# Patient Record
Sex: Male | Born: 1960 | Race: White | Hispanic: No | Marital: Married | Smoking: Former smoker
Health system: Southern US, Community
[De-identification: ages and names within clinical notes are randomized; demographics above are authoritative.]

## PROBLEM LIST (undated history)

## (undated) DIAGNOSIS — E119 Type 2 diabetes mellitus without complications: Secondary | ICD-10-CM

## (undated) DIAGNOSIS — E7800 Pure hypercholesterolemia, unspecified: Secondary | ICD-10-CM

## (undated) DIAGNOSIS — J45909 Unspecified asthma, uncomplicated: Secondary | ICD-10-CM

## (undated) HISTORY — PX: KNEE SURGERY: SHX244

## (undated) HISTORY — PX: HAND SURGERY: SHX662

---

## 2007-02-13 ENCOUNTER — Ambulatory Visit: Payer: Self-pay | Admitting: Cardiology

## 2007-02-19 ENCOUNTER — Ambulatory Visit: Payer: Self-pay | Admitting: Cardiology

## 2007-02-20 ENCOUNTER — Inpatient Hospital Stay (HOSPITAL_BASED_OUTPATIENT_CLINIC_OR_DEPARTMENT_OTHER): Admission: RE | Admit: 2007-02-20 | Discharge: 2007-02-20 | Payer: Self-pay | Admitting: Cardiology

## 2007-02-20 ENCOUNTER — Ambulatory Visit: Payer: Self-pay | Admitting: Cardiology

## 2010-09-12 ENCOUNTER — Emergency Department: Payer: Self-pay | Admitting: Emergency Medicine

## 2014-08-31 ENCOUNTER — Emergency Department (HOSPITAL_COMMUNITY): Payer: Self-pay

## 2014-08-31 ENCOUNTER — Emergency Department (HOSPITAL_COMMUNITY)
Admission: EM | Admit: 2014-08-31 | Discharge: 2014-08-31 | Disposition: A | Discharge status: Home / Self Care | Payer: Self-pay | Attending: Emergency Medicine | Admitting: Emergency Medicine

## 2014-08-31 ENCOUNTER — Encounter (HOSPITAL_COMMUNITY): Payer: Self-pay | Admitting: Emergency Medicine

## 2014-08-31 DIAGNOSIS — F17200 Nicotine dependence, unspecified, uncomplicated: Secondary | ICD-10-CM | POA: Insufficient documentation

## 2014-08-31 DIAGNOSIS — Z79899 Other long term (current) drug therapy: Secondary | ICD-10-CM | POA: Insufficient documentation

## 2014-08-31 DIAGNOSIS — R42 Dizziness and giddiness: Secondary | ICD-10-CM | POA: Insufficient documentation

## 2014-08-31 DIAGNOSIS — J45909 Unspecified asthma, uncomplicated: Secondary | ICD-10-CM | POA: Insufficient documentation

## 2014-08-31 DIAGNOSIS — Z8639 Personal history of other endocrine, nutritional and metabolic disease: Secondary | ICD-10-CM | POA: Insufficient documentation

## 2014-08-31 DIAGNOSIS — Z862 Personal history of diseases of the blood and blood-forming organs and certain disorders involving the immune mechanism: Secondary | ICD-10-CM | POA: Insufficient documentation

## 2014-08-31 HISTORY — DX: Unspecified asthma, uncomplicated: J45.909

## 2014-08-31 HISTORY — DX: Pure hypercholesterolemia, unspecified: E78.00

## 2014-08-31 LAB — CBC WITH DIFFERENTIAL/PLATELET
Basophils Absolute: 0 10*3/uL (ref 0.0–0.1)
Basophils Relative: 0 % (ref 0–1)
EOS ABS: 0.2 10*3/uL (ref 0.0–0.7)
Eosinophils Relative: 2 % (ref 0–5)
HCT: 43.1 % (ref 39.0–52.0)
HEMOGLOBIN: 15.1 g/dL (ref 13.0–17.0)
LYMPHS ABS: 2.5 10*3/uL (ref 0.7–4.0)
Lymphocytes Relative: 30 % (ref 12–46)
MCH: 32.1 pg (ref 26.0–34.0)
MCHC: 35 g/dL (ref 30.0–36.0)
MCV: 91.5 fL (ref 78.0–100.0)
Monocytes Absolute: 0.6 10*3/uL (ref 0.1–1.0)
Monocytes Relative: 8 % (ref 3–12)
NEUTROS PCT: 60 % (ref 43–77)
Neutro Abs: 5 10*3/uL (ref 1.7–7.7)
Platelets: 177 10*3/uL (ref 150–400)
RBC: 4.71 MIL/uL (ref 4.22–5.81)
RDW: 12.9 % (ref 11.5–15.5)
WBC: 8.3 10*3/uL (ref 4.0–10.5)

## 2014-08-31 LAB — BASIC METABOLIC PANEL
Anion gap: 9 (ref 5–15)
BUN: 12 mg/dL (ref 6–23)
CO2: 29 meq/L (ref 19–32)
Calcium: 9.2 mg/dL (ref 8.4–10.5)
Chloride: 104 mEq/L (ref 96–112)
Creatinine, Ser: 1.02 mg/dL (ref 0.50–1.35)
GFR calc Af Amer: >90 mL/min (ref >90)
GFR, EST NON AFRICAN AMERICAN: 83 mL/min — AB (ref >90)
GLUCOSE: 92 mg/dL (ref 70–99)
POTASSIUM: 3.9 meq/L (ref 3.7–5.3)
Sodium: 142 mEq/L (ref 137–147)

## 2014-08-31 LAB — CBG MONITORING, ED: Glucose-Capillary: 120 mg/dL — ABNORMAL HIGH (ref 70–99)

## 2014-08-31 MED ORDER — LORAZEPAM 1 MG PO TABS: 1.0000 mg | ORAL_TABLET | Freq: Three times a day (TID) | ORAL | Status: DC | PRN

## 2014-08-31 MED ORDER — MECLIZINE HCL 25 MG PO TABS: 25.0000 mg | ORAL_TABLET | Freq: Three times a day (TID) | ORAL | Status: DC | PRN

## 2014-08-31 NOTE — ED Provider Notes (Signed)
CSN: [id number]     Arrival date & time 08/31/14  1435 History   This chart was scribed for Donnetta Hutching, MD by Freida Busman, ED Scribe. This patient was seen in room APA03/APA03 and the patient's care was started 3:20 PM.    Chief Complaint  Patient presents with  . Dizziness    The history is provided by the patient and the spouse. No language interpreter was used.   HPI Comments:  Vincent Singleton is a 53 y.o. male who presents to the Emergency Department complaining of dizziness that started this am. Pt reports getting out of his car and  feeling "off balance" . Pt took meclizine and fell asleep for 3 hours but when he woke up dizziness was still present. Pt denies weakness to his extremities. Also denies recent illness.and h/o similar symptom. No stiff neck, fever, chills.  No chronic health problems. No medication     Past Medical History  Diagnosis Date  . Asthma   . High cholesterol    Past Surgical History  Procedure Laterality Date  . Hand surgery    . Knee surgery     No family history on file. History  Substance Use Topics  . Smoking status: Current Every Day Smoker -- 1.00 packs/day    Types: Cigarettes  . Smokeless tobacco: Not on file  . Alcohol Use: No    Review of Systems  At least 10pt or greater review of systems completed and are negative except where specified in the HPI.     Allergies  Review of patient's allergies indicates no known allergies.  Home Medications   Prior to Admission medications   Medication Sig Start Date End Date Taking? Authorizing Provider  ibuprofen (ADVIL,MOTRIN) 200 MG tablet Take 400 mg by mouth every 6 (six) hours as needed for mild pain or moderate pain.   Yes Historical Provider, MD  meclizine (ANTIVERT) 12.5 MG tablet Take 12.5 mg by mouth once as needed for dizziness.   Yes Historical Provider, MD  LORazepam (ATIVAN) 1 MG tablet Take 1 tablet (1 mg total) by mouth 3 (three) times daily as needed (dizziness). 08/31/14    Donnetta Hutching, MD  meclizine (ANTIVERT) 25 MG tablet Take 1 tablet (25 mg total) by mouth 3 (three) times daily as needed for dizziness. 08/31/14   Donnetta Hutching, MD   BP 109/60  Pulse 55  Temp(Src) 97.7 F (36.5 C) (Oral)  Resp 14  Ht  (1.778 m)  Wt 240 lb (108.863 kg)  BMI 34.44 kg/m2  SpO2 99% Physical Exam  Nursing note and vitals reviewed. Constitutional: He is oriented to person, place, and time. He appears well-developed and well-nourished.  HENT:  Head: Normocephalic and atraumatic.  Eyes: Conjunctivae and EOM are normal. Pupils are equal, round, and reactive to light.  Neck: Normal range of motion. Neck supple.  Cardiovascular: Normal rate, regular rhythm and normal heart sounds.   Pulmonary/Chest: Effort normal and breath sounds normal.  Abdominal: Soft. Bowel sounds are normal.  Musculoskeletal: Normal range of motion.  Neurological: He is alert and oriented to person, place, and time.  Skin: Skin is warm and dry.  Psychiatric: He has a normal mood and affect. His behavior is normal.    ED Course  Procedures   DIAGNOSTIC STUDIES:  Oxygen Saturation is 100% on RA, normal by my interpretation.    COORDINATION OF CARE:  3:23 PM Discussed treatment plan with pt at bedside and pt agreed to plan.  Labs Review  Labs Reviewed  BASIC METABOLIC PANEL - Abnormal; Notable for the following:    GFR calc non Af Amer 83 (*)    All other components within normal limits  CBG MONITORING, ED - Abnormal; Notable for the following:    Glucose-Capillary 120 (*)    All other components within normal limits  CBC WITH DIFFERENTIAL    Imaging Review Ct Head Wo Contrast  08/31/2014   CLINICAL DATA:  DIZZINESS  EXAM: CT HEAD WITHOUT CONTRAST  TECHNIQUE: Contiguous axial images were obtained from the base of the skull through the vertex without intravenous contrast.  COMPARISON:  None.  FINDINGS: There is no evidence of mass effect, midline shift or extra-axial fluid collections.  There is no evidence of a space-occupying lesion or intracranial hemorrhage. There is no evidence of a cortical-based area of acute infarction.  The ventricles and sulci are appropriate for the patient's age. The basal cisterns are patent.  Visualized portions of the orbits are unremarkable. The visualized portions of the paranasal sinuses and mastoid air cells are unremarkable.  The osseous structures are unremarkable.  IMPRESSION: Normal CT of the brain without intravenous contrast.   Electronically Signed   By: Elige Ko   On: 08/31/2014 16:13     EKG Interpretation   Date/Time:  Monday August 31 2014 14:42:02 EDT Ventricular Rate:  52 PR Interval:  140 QRS Duration: 100 QT Interval:  386 QTC Calculation: 358 R Axis:   87 Text Interpretation:  Sinus bradycardia Nonspecific T wave abnormality  Abnormal ECG No previous ECGs available Confirmed by Manus Gunning  MD, STEPHEN  ([id number]) on 08/31/2014 3:02:07 PM      MDM   Final diagnoses:  Vertigo    No gross neurological deficits. CT head negative. Labs negative. Patient is alert and oriented x3, moving all extremities vigorously. History and physical most consistent with vertigo. Discharge medications Antivert 25 mg, Ativan 1 mg   I personally performed the services described in this documentation, which was scribed in my presence. The recorded information has been reviewed and is accurate.    Donnetta Hutching, MD 08/31/14 1755

## 2014-08-31 NOTE — ED Notes (Signed)
CBG in triage 120.

## 2014-08-31 NOTE — ED Notes (Addendum)
Pt c/o intermittent dizziness and blurred vision, worse with movement since 0645 this am. Tingling in left hand. Face symmetrical, speech clear.denies ha. Pt took meclizine at 0900 this am and went to sleep and woke up with no relief of sx.

## 2014-08-31 NOTE — Discharge Instructions (Signed)
Benign Positional Vertigo Vertigo means you feel like you or your surroundings are moving when they are not. Benign positional vertigo is the most common form of vertigo. Benign means that the cause of your condition is not serious. Benign positional vertigo is more common in older adults. CAUSES  Benign positional vertigo is the result of an upset in the labyrinth system. This is an area in the middle ear that helps control your balance. This may be caused by a viral infection, head injury, or repetitive motion. However, often no specific cause is found. SYMPTOMS  Symptoms of benign positional vertigo occur when you move your head or eyes in different directions. Some of the symptoms may include:  Loss of balance and falls.  Vomiting.  Blurred vision.  Dizziness.  Nausea.  Involuntary eye movements (nystagmus). DIAGNOSIS  Benign positional vertigo is usually diagnosed by physical exam. If the specific cause of your benign positional vertigo is unknown, your caregiver may perform imaging tests, such as magnetic resonance imaging (MRI) or computed tomography (CT). TREATMENT  Your caregiver may recommend movements or procedures to correct the benign positional vertigo. Medicines such as meclizine, benzodiazepines, and medicines for nausea may be used to treat your symptoms. In rare cases, if your symptoms are caused by certain conditions that affect the inner ear, you may need surgery. HOME CARE INSTRUCTIONS   Follow your caregiver's instructions.  Move slowly. Do not make sudden body or head movements.  Avoid driving.  Avoid operating heavy machinery.  Avoid performing any tasks that would be dangerous to you or others during a vertigo episode.  Drink enough fluids to keep your urine clear or pale yellow. SEEK IMMEDIATE MEDICAL CARE IF:   You develop problems with walking, weakness, numbness, or using your arms, hands, or legs.  You have difficulty speaking.  You develop  severe headaches.  Your nausea or vomiting continues or gets worse.  You develop visual changes.  Your family or friends notice any behavioral changes.  Your condition gets worse.  You have a fever.  You develop a stiff neck or sensitivity to light. MAKE SURE YOU:   Understand these instructions.  Will watch your condition.  Will get help right away if you are not doing well or get worse. Document Released: 09/04/2006 Document Revised: 02/19/2012 Document Reviewed: 08/17/2011 [facility] Patient Information 2015 [place], [place]. This information is not intended to replace advice given to you by your health care provider. Make sure you discuss any questions you have with your health care provider.   CT scan of head and blood work were normal.  Medication for dizziness and relaxation.  Return if worse

## 2019-12-15 ENCOUNTER — Other Ambulatory Visit: Payer: Self-pay

## 2019-12-15 ENCOUNTER — Ambulatory Visit: Payer: Self-pay | Attending: Internal Medicine

## 2019-12-15 DIAGNOSIS — Z20822 Contact with and (suspected) exposure to covid-19: Secondary | ICD-10-CM | POA: Insufficient documentation

## 2019-12-16 LAB — NOVEL CORONAVIRUS, NAA: SARS-CoV-2, NAA: NOT DETECTED

## 2019-12-18 ENCOUNTER — Telehealth: Payer: Self-pay | Admitting: *Deleted

## 2019-12-18 ENCOUNTER — Telehealth: Payer: Self-pay

## 2019-12-18 NOTE — Telephone Encounter (Signed)
Patient needs result faxed to [facility]  Attn: Hilda Lias Fax: [phone]

## 2019-12-18 NOTE — Telephone Encounter (Signed)
Called for a copy of negative Covid 19 results. Sent MyChart code via phone # as discussed with the patient.

## 2021-05-30 ENCOUNTER — Emergency Department (HOSPITAL_COMMUNITY)
Admission: EM | Admit: 2021-05-30 | Discharge: 2021-05-31 | Disposition: A | Discharge status: Home / Self Care | Payer: No Typology Code available for payment source | Attending: Emergency Medicine | Admitting: Emergency Medicine

## 2021-05-30 ENCOUNTER — Emergency Department (HOSPITAL_COMMUNITY): Payer: No Typology Code available for payment source

## 2021-05-30 DIAGNOSIS — R079 Chest pain, unspecified: Secondary | ICD-10-CM | POA: Insufficient documentation

## 2021-05-30 DIAGNOSIS — R519 Headache, unspecified: Secondary | ICD-10-CM | POA: Diagnosis not present

## 2021-05-30 DIAGNOSIS — Z87891 Personal history of nicotine dependence: Secondary | ICD-10-CM | POA: Diagnosis not present

## 2021-05-30 DIAGNOSIS — Z20822 Contact with and (suspected) exposure to covid-19: Secondary | ICD-10-CM | POA: Diagnosis not present

## 2021-05-30 DIAGNOSIS — J45909 Unspecified asthma, uncomplicated: Secondary | ICD-10-CM | POA: Diagnosis not present

## 2021-05-30 DIAGNOSIS — E119 Type 2 diabetes mellitus without complications: Secondary | ICD-10-CM | POA: Insufficient documentation

## 2021-05-30 LAB — CBC
HCT: 46.7 % (ref 39.0–52.0)
Hemoglobin: 15.6 g/dL (ref 13.0–17.0)
MCH: 30.5 pg (ref 26.0–34.0)
MCHC: 33.4 g/dL (ref 30.0–36.0)
MCV: 91.2 fL (ref 80.0–100.0)
Platelets: 164 10*3/uL (ref 150–400)
RBC: 5.12 MIL/uL (ref 4.22–5.81)
RDW: 12.3 % (ref 11.5–15.5)
WBC: 7.5 10*3/uL (ref 4.0–10.5)
nRBC: 0 % (ref 0.0–0.2)

## 2021-05-30 LAB — BASIC METABOLIC PANEL
Anion gap: 8 (ref 5–15)
BUN: 15 mg/dL (ref 6–20)
CO2: 26 mmol/L (ref 22–32)
Calcium: 9 mg/dL (ref 8.9–10.3)
Chloride: 104 mmol/L (ref 98–111)
Creatinine, Ser: 1.04 mg/dL (ref 0.61–1.24)
GFR, Estimated: >60 mL/min (ref >60)
Glucose, Bld: 115 mg/dL — ABNORMAL HIGH (ref 70–99)
Potassium: 3.8 mmol/L (ref 3.5–5.1)
Sodium: 138 mmol/L (ref 135–145)

## 2021-05-30 LAB — RAPID URINE DRUG SCREEN, HOSP PERFORMED
Amphetamines: NOT DETECTED
Barbiturates: POSITIVE — AB
Benzodiazepines: NOT DETECTED
Cocaine: NOT DETECTED
Opiates: POSITIVE — AB
Tetrahydrocannabinol: NOT DETECTED

## 2021-05-30 LAB — TROPONIN I (HIGH SENSITIVITY): Troponin I (High Sensitivity): 6 ng/L (ref <18)

## 2021-05-30 NOTE — ED Triage Notes (Signed)
Patient presents with chest pressure and headache. Was seen at [facility] earlier this am with chest pain work up and head CT negative.  Feels he needs to be re evaluated.

## 2021-05-30 NOTE — ED Provider Notes (Signed)
Emergency Medicine Provider Triage Evaluation Note  Vincent Singleton , a 60 y.o. male  was evaluated in triage.  Pt complains of headache and chest pain. He was at [facility] earlier today for the same, had negative head CT, negative delta troponins and was discharged. Patient feels like he needs additional evaluation.  His chest pain has been constant since yesterday as has his headache.  Review of Systems  Positive: Headache, chest pain Negative: Ever, cough  Physical Exam  BP (!) 142/89 (BP Location: Left Arm)   Pulse (!) 47   Temp 98.2 F (36.8 C) (Oral)   Resp 16   Wt 99.8 kg   SpO2 100%   BMI 31.57 kg/m  Gen:   Awake, no distress   Resp:  Normal effort  MSK:   Moves extremities without difficulty  Other:  Normal gait  Medical Decision Making  Medically screening exam initiated at 8:52 PM.  Appropriate orders placed.  Vincent Singleton was informed that the remainder of the evaluation will be completed by another provider, this initial triage assessment does not replace that evaluation, and the importance of remaining in the ED until their evaluation is complete.  Note: Portions of this report may have been transcribed using voice recognition software. Every effort was made to ensure accuracy; however, inadvertent computerized transcription errors may be present    Vincent Singleton 05/30/21 2054    Mancel Bale, MD 05/31/21 1136

## 2021-05-31 ENCOUNTER — Emergency Department (HOSPITAL_BASED_OUTPATIENT_CLINIC_OR_DEPARTMENT_OTHER)
Admission: EM | Admit: 2021-05-31 | Discharge: 2021-05-31 | Disposition: A | Discharge status: Home / Self Care | Payer: No Typology Code available for payment source | Source: Home / Self Care | Attending: Emergency Medicine | Admitting: Emergency Medicine

## 2021-05-31 ENCOUNTER — Emergency Department (HOSPITAL_BASED_OUTPATIENT_CLINIC_OR_DEPARTMENT_OTHER): Payer: No Typology Code available for payment source | Admitting: Radiology

## 2021-05-31 ENCOUNTER — Other Ambulatory Visit: Payer: Self-pay

## 2021-05-31 ENCOUNTER — Encounter (HOSPITAL_BASED_OUTPATIENT_CLINIC_OR_DEPARTMENT_OTHER): Payer: Self-pay | Admitting: Obstetrics and Gynecology

## 2021-05-31 DIAGNOSIS — Z20822 Contact with and (suspected) exposure to covid-19: Secondary | ICD-10-CM | POA: Insufficient documentation

## 2021-05-31 DIAGNOSIS — R519 Headache, unspecified: Secondary | ICD-10-CM

## 2021-05-31 DIAGNOSIS — J45909 Unspecified asthma, uncomplicated: Secondary | ICD-10-CM | POA: Insufficient documentation

## 2021-05-31 DIAGNOSIS — R072 Precordial pain: Secondary | ICD-10-CM

## 2021-05-31 DIAGNOSIS — E119 Type 2 diabetes mellitus without complications: Secondary | ICD-10-CM | POA: Insufficient documentation

## 2021-05-31 DIAGNOSIS — Z87891 Personal history of nicotine dependence: Secondary | ICD-10-CM | POA: Insufficient documentation

## 2021-05-31 HISTORY — DX: Type 2 diabetes mellitus without complications: E11.9

## 2021-05-31 LAB — HEPATIC FUNCTION PANEL
ALT: 13 U/L (ref 0–44)
AST: 14 U/L — ABNORMAL LOW (ref 15–41)
Albumin: 3.9 g/dL (ref 3.5–5.0)
Alkaline Phosphatase: 53 U/L (ref 38–126)
Bilirubin, Direct: 0.1 mg/dL (ref 0.0–0.2)
Indirect Bilirubin: 0.4 mg/dL (ref 0.3–0.9)
Total Bilirubin: 0.5 mg/dL (ref 0.3–1.2)
Total Protein: 6.6 g/dL (ref 6.5–8.1)

## 2021-05-31 LAB — CBC
HCT: 48.4 % (ref 39.0–52.0)
Hemoglobin: 16.3 g/dL (ref 13.0–17.0)
MCH: 30.2 pg (ref 26.0–34.0)
MCHC: 33.7 g/dL (ref 30.0–36.0)
MCV: 89.8 fL (ref 80.0–100.0)
Platelets: 165 10*3/uL (ref 150–400)
RBC: 5.39 MIL/uL (ref 4.22–5.81)
RDW: 12.6 % (ref 11.5–15.5)
WBC: 7.2 10*3/uL (ref 4.0–10.5)
nRBC: 0 % (ref 0.0–0.2)

## 2021-05-31 LAB — BASIC METABOLIC PANEL
Anion gap: 7 (ref 5–15)
BUN: 12 mg/dL (ref 6–20)
CO2: 29 mmol/L (ref 22–32)
Calcium: 9.4 mg/dL (ref 8.9–10.3)
Chloride: 101 mmol/L (ref 98–111)
Creatinine, Ser: 1.07 mg/dL (ref 0.61–1.24)
GFR, Estimated: >60 mL/min (ref >60)
Glucose, Bld: 172 mg/dL — ABNORMAL HIGH (ref 70–99)
Potassium: 4 mmol/L (ref 3.5–5.1)
Sodium: 137 mmol/L (ref 135–145)

## 2021-05-31 LAB — LIPASE, BLOOD: Lipase: 20 U/L (ref 11–51)

## 2021-05-31 LAB — RESP PANEL BY RT-PCR (FLU A&B, COVID) ARPGX2
Influenza A by PCR: NEGATIVE
Influenza B by PCR: NEGATIVE
SARS Coronavirus 2 by RT PCR: NEGATIVE

## 2021-05-31 LAB — TSH: TSH: 1.182 u[IU]/mL (ref 0.350–4.500)

## 2021-05-31 LAB — TROPONIN I (HIGH SENSITIVITY): Troponin I (High Sensitivity): 3 ng/L (ref <18)

## 2021-05-31 NOTE — Discharge Instructions (Addendum)
1.  Continue with all of your planned follow-up appointments. 2.  Take Tylenol as needed for headache and try to rest as much as possible. 3.  Return to the emergency department if there are are new associated symptoms, worsening of symptoms you have or other concerning changes develop.

## 2021-05-31 NOTE — ED Notes (Addendum)
Pt assisted with ambulation in hallway on EKG monitor.  HR 73, sinus, O2 sat 99-100% on room air.  Dr Donnald Garre aware.

## 2021-05-31 NOTE — ED Provider Notes (Signed)
[facility] EMERGENCY DEPT Provider Note   CSN: [id number] Arrival date & time: 05/31/21  1457     History Chief Complaint  Patient presents with   Chest Pain    Vincent Singleton is a 60 y.o. male.  HPI Patient has symptoms of headache and chest discomfort for about 4 days.  He reports symptoms started first with a headache 4 days ago.  It seemed like a "normal" headache.  Patient reports he does not typically get headaches however he woke up in the morning and he had a general aching which responded to ibuprofen.  He did not have associated symptoms.  Headache was gone and no other symptoms develop.  Patient denies has had any double vision or loss of vision.  He reports he has not had his glasses prescription changed for a number of years and he starting to find that his vision is little more blurry.  Patient denies photophobia but does report when we is out in the bright sun he is trying to feel he needs sunglasses.  No nausea or vomiting.  No neck stiffness.  The headache was present the following morning as well.  He reports it seemed better after ibuprofen again.  The next day, it was there and did not seem better and he also developed a feeling of pressure in the left upper chest with some radiation toward the underarm.  He reports it was not as severe chest pain.  It felt like a pressure and he did feel little short of breath with it.  At that point he determined to get evaluation.  Patient denies has had fevers or chills.  He is not any productive cough.  No calf pain or lower extremity swelling.  Patient has not had stress test.  He does have history of diabetes diagnosed about 6 months ago and hypercholesterolemia.  Patient does not have hypertension and is not on antihypertensive medications.   Patient was evaluated at [facility] emergency department and had a CT scan of the head with no acute findings and troponins with no elevation.  Patient was also seen at [facility] emergency  department with stable troponins.  He has follow-up with cardiology scheduled for July 5 and follow-up with PCP at the [facility] this week on Friday.    Past Medical History:  Diagnosis Date   Asthma    Diabetes mellitus without complication (HCC)    High cholesterol     There are no problems to display for this patient.   Past Surgical History:  Procedure Laterality Date   HAND SURGERY     KNEE SURGERY         No family history on file.  Social History   Tobacco Use   Smoking status: Former    Packs/day: 1.00    Years: 40.00    Pack years: 40.00    Types: Cigarettes   Smokeless tobacco: Never  Vaping Use   Vaping Use: Never used  Substance Use Topics   Alcohol use: No   Drug use: No    Home Medications Prior to Admission medications   Medication Sig Start Date End Date Taking? Authorizing Provider  ibuprofen (ADVIL,MOTRIN) 200 MG tablet Take 400 mg by mouth every 6 (six) hours as needed for mild pain or moderate pain.    [provider]  LORazepam (ATIVAN) 1 MG tablet Take 1 tablet (1 mg total) by mouth 3 (three) times daily as needed (dizziness). 08/31/14   Donnetta Hutching, MD  meclizine Lezlie Octave)  12.5 MG tablet Take 12.5 mg by mouth once as needed for dizziness.    [provider]  meclizine (ANTIVERT) 25 MG tablet Take 1 tablet (25 mg total) by mouth 3 (three) times daily as needed for dizziness. 08/31/14   Donnetta Hutching, MD    Allergies    Patient has no known allergies.  Review of Systems   Review of Systems 10 systems reviewed and negative except as per HPI Physical Exam Updated Vital Signs BP 109/72   Pulse (!) 55   Temp 98.2 F (36.8 C) (Oral)   Resp 13   Ht 5\' 11"  (1.803 m)   Wt 99.8 kg   SpO2 97%   BMI 30.68 kg/m   Physical Exam Constitutional:      Appearance: Normal appearance.  HENT:     Head: Normocephalic and atraumatic.     Ears:     Comments: Bilateral TMs with some tympanosclerosis and retraction.  Ear canals are narrow.   No erythema or bulging.    Mouth/Throat:     Mouth: Mucous membranes are moist.     Pharynx: Oropharynx is clear.  Eyes:     Extraocular Movements: Extraocular movements intact.     Conjunctiva/sclera: Conjunctivae normal.     Pupils: Pupils are equal, round, and reactive to light.  Cardiovascular:     Rate and Rhythm: Normal rate and regular rhythm.  Pulmonary:     Effort: Pulmonary effort is normal.     Breath sounds: Normal breath sounds.  Abdominal:     General: There is no distension.     Palpations: Abdomen is soft.     Tenderness: There is no abdominal tenderness. There is no guarding.  Musculoskeletal:        General: No swelling or tenderness. Normal range of motion.     Cervical back: Neck supple.     Right lower leg: No edema.     Left lower leg: No edema.  Skin:    General: Skin is warm and dry.  Neurological:     General: No focal deficit present.     Mental Status: He is alert and oriented to person, place, and time.     Cranial Nerves: No cranial nerve deficit.     Sensory: No sensory deficit.     Motor: No weakness.     Coordination: Coordination normal.  Psychiatric:        Mood and Affect: Mood normal.    ED Results / Procedures / Treatments   Labs (all labs ordered are listed, but only abnormal results are displayed) Labs Reviewed  BASIC METABOLIC PANEL - Abnormal; Notable for the following components:      Result Value   Glucose, Bld 172 (*)    All other components within normal limits  HEPATIC FUNCTION PANEL - Abnormal; Notable for the following components:   AST 14 (*)    All other components within normal limits  RESP PANEL BY RT-PCR (FLU A&B, COVID) ARPGX2  CBC  LIPASE, BLOOD  TSH  TROPONIN I (HIGH SENSITIVITY)    EKG EKG Interpretation  Date/Time:  Tuesday May 31 2021 15:05:53 EDT Ventricular Rate:  53 PR Interval:  142 QRS Duration: 98 QT Interval:  398 QTC Calculation: 373 R Axis:   77 Text Interpretation: Sinus bradycardia  Nonspecific T wave abnormality Abnormal ECG inferior ST changes and bradycardia unchanged from previous Confirmed by 03[birth year]-1996 [phone]) on 05/31/2021 6:10:41 PM  Radiology DG Chest 2 View  Result Date: 05/31/2021  CLINICAL DATA:  Chest pain and pressure. Pain radiates to the left shoulder. Shortness of breath. EXAM: CHEST - 2 VIEW COMPARISON:  Radiographs yesterday. FINDINGS: The cardiomediastinal contours are normal. The lungs are clear. Pulmonary vasculature is normal. No consolidation, pleural effusion, or pneumothorax. No acute osseous abnormalities are seen. IMPRESSION: Negative radiographs of the chest. No radiographic explanation for chest pain or change from exams yesterday. Electronically Signed   By: Narda Rutherford M.D.   On: 05/31/2021 15:40   DG Chest 2 View  Result Date: 05/30/2021 CLINICAL DATA:  Chest pain, dizziness and nausea.  Headache. EXAM: CHEST - 2 VIEW COMPARISON:  Radiograph earlier this day at [facility]. FINDINGS: The cardiomediastinal contours are normal. The lungs are clear. Pulmonary vasculature is normal. No consolidation, pleural effusion, or pneumothorax. Thoracic spondylosis with endplate spurring. No acute osseous abnormalities are seen. IMPRESSION: Negative radiographs of the chest. Electronically Signed   By: Narda Rutherford M.D.   On: 05/30/2021 21:53    Procedures Procedures   Medications Ordered in ED Medications - No data to display  ED Course  I have reviewed the triage vital signs and the nursing notes.  Pertinent labs & imaging results that were available during my care of the patient were reviewed by me and considered in my medical decision making (see chart for details).    MDM Rules/Calculators/A&P                          Patient presents as outlined.  Clinically, exam is normal.  No abnormal neurologic examination.  Patient had CT head done within the past 2 days.  No acute findings.  No findings today to suggest infectious etiology.   No thunderclap headache to suggest subarachnoid.  Patient does not have hypertension.  Headache has been resolving with regular OTC medications.  Consideration given to tension headache but will require further work-up and return precautions reviewed regarding headache  Patient also has been having chest pain.  He does have some risk factors.  EKG is unchanged from 2015 tracing.  Patient has had troponins done over the past several days with normal findings.  At this time recommendation is to follow-up with PCP and cardiology for ongoing outpatient testing.  Return precautions reviewed. Final Clinical Impression(s) / ED Diagnoses Final diagnoses:  Nonintractable headache, unspecified chronicity pattern, unspecified headache type  Precordial pain    Rx / DC Orders ED Discharge Orders     None        Arby Barrette, MD 05/31/21 1818

## 2021-05-31 NOTE — ED Notes (Signed)
Pt called 3x no answer  

## 2021-05-31 NOTE — ED Triage Notes (Signed)
Patient reports to the ER with chest pressure and bradycardia. Patient reports the chest pain is radiating to left shoulder. Patient endorses SOB. Denies N/v/D

## 2021-05-31 NOTE — ED Notes (Signed)
Vitals called x6 

## 2022-10-13 IMAGING — DX DG CHEST 2V
2 series · 2 of 2 positions shown · non-contrast
Comparison: Radiographs yesterday.

CLINICAL DATA: Chest pain and pressure. Pain radiates to the left
shoulder. Shortness of breath.

EXAM:
CHEST - 2 VIEW

[chest pa]
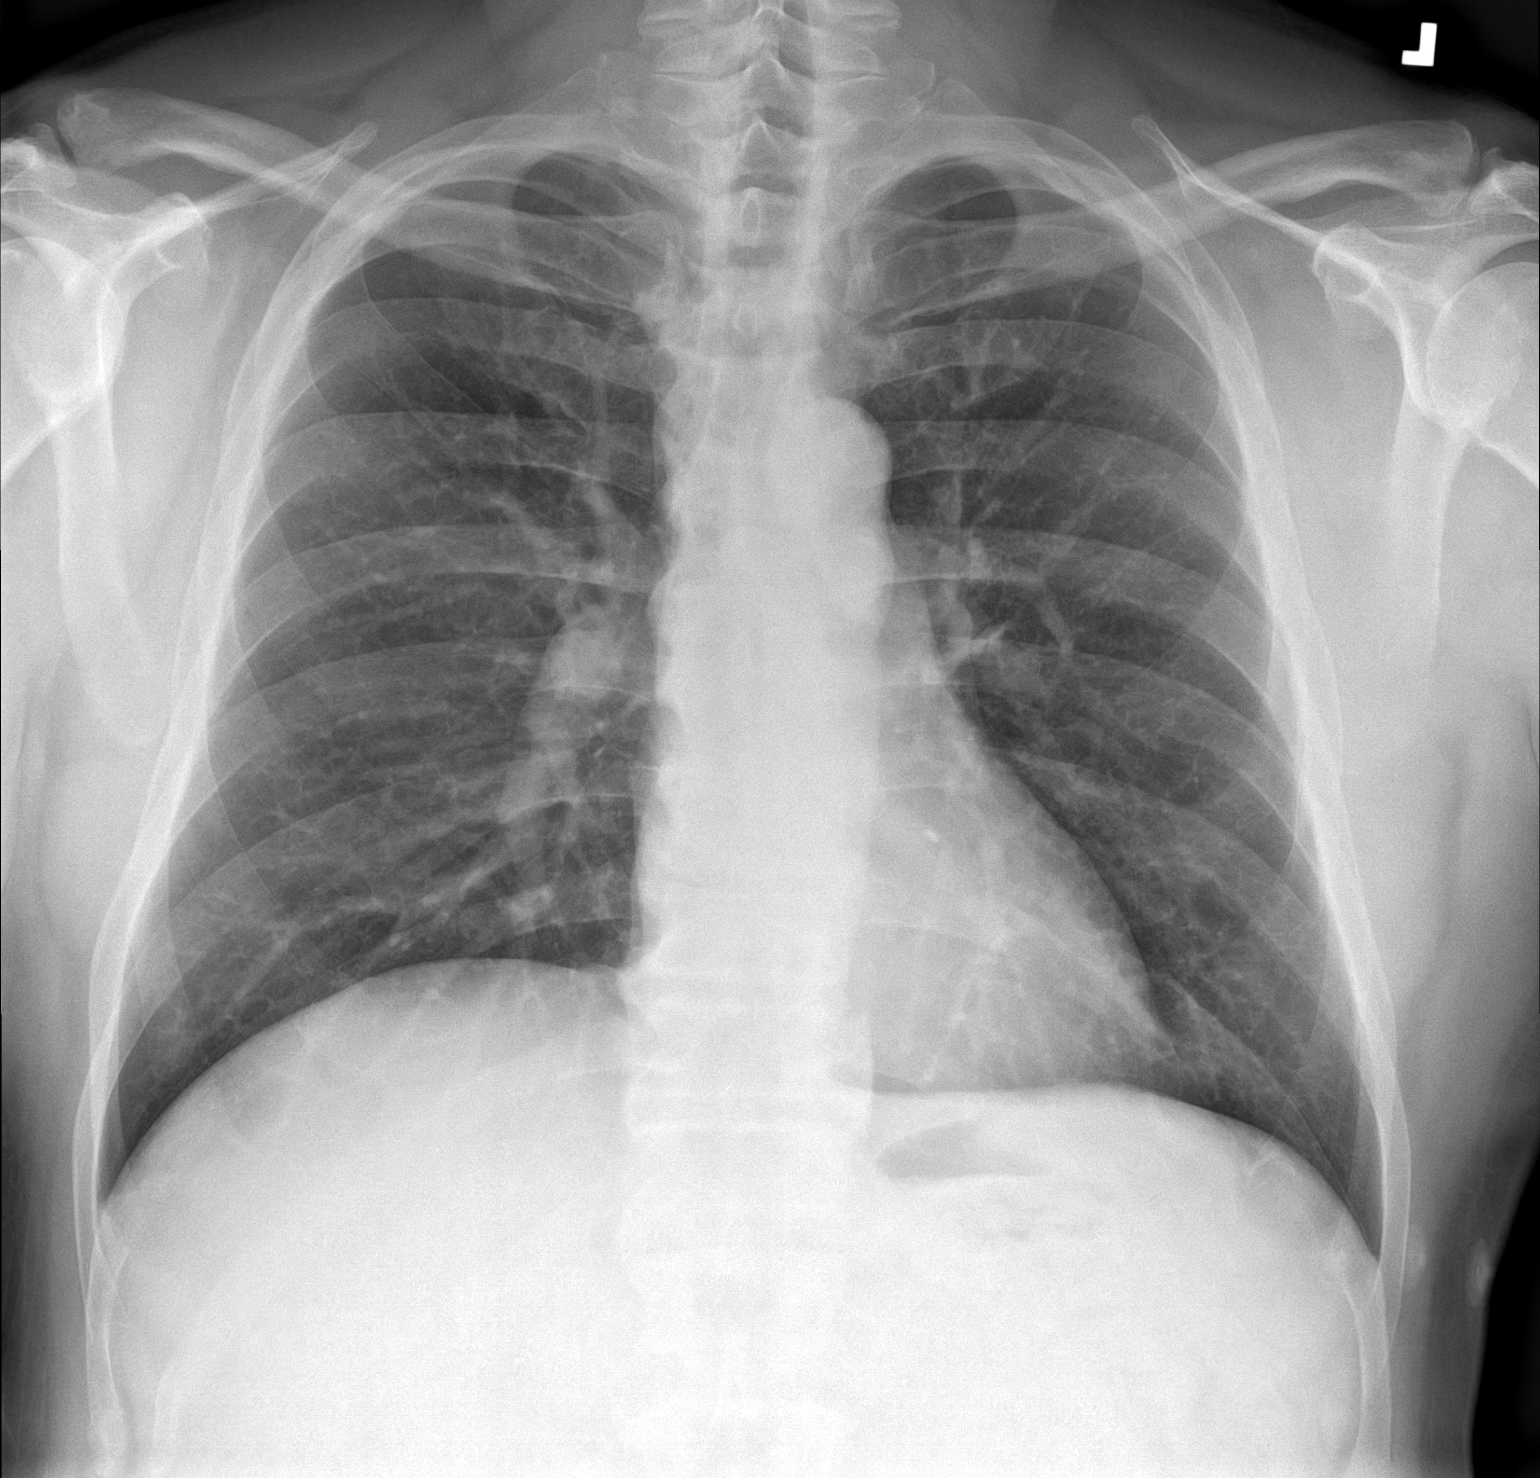

[chest lat]
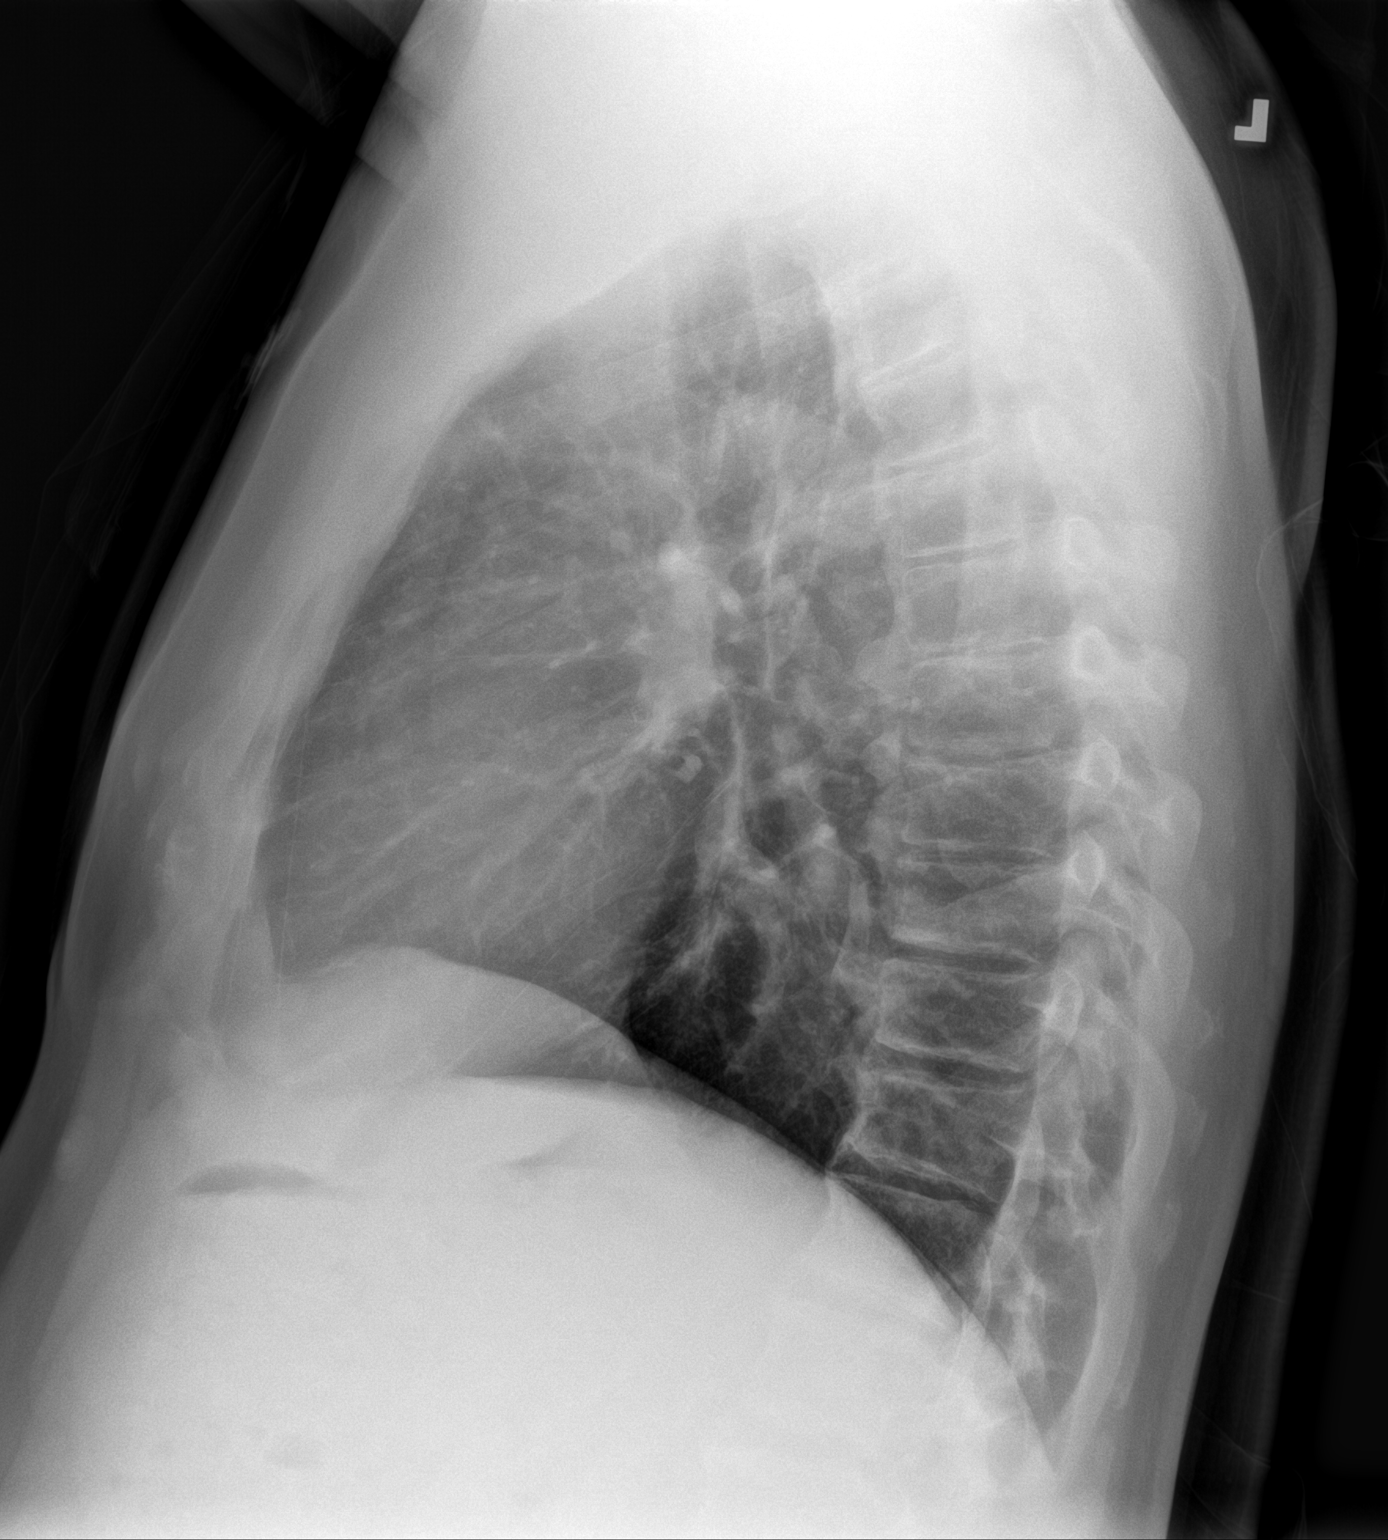

[2 of 2 positions shown; findings below may reference images not displayed]

FINDINGS: The cardiomediastinal contours are normal. The lungs are clear.
Pulmonary vasculature is normal. No consolidation, pleural effusion,
or pneumothorax. No acute osseous abnormalities are seen.
IMPRESSION: Negative radiographs of the chest. No radiographic explanation for
chest pain or change from exams yesterday.

## 2023-06-13 ENCOUNTER — Encounter: Payer: Self-pay | Admitting: *Deleted

## 2023-07-13 ENCOUNTER — Telehealth: Payer: Self-pay | Admitting: *Deleted

## 2023-07-13 NOTE — Telephone Encounter (Signed)
Received call from Vincent Singleton with [facility] that they are cancelling the referral to [place] for this patient for procedure. Patient has not been responsive back to them with messages and Berkley Harvey is no longer valid. They have also sent a message to patient informing of this as well. FYI

## 2023-07-16 NOTE — Telephone Encounter (Signed)
Referral has been canceled.

## 2023-07-18 ENCOUNTER — Telehealth: Payer: Self-pay | Admitting: Internal Medicine

## 2023-07-18 NOTE — Telephone Encounter (Signed)
Pt's questionnaire is in review.

## 2023-07-18 NOTE — Telephone Encounter (Signed)
I have emailed [place] at [facility] and spoke with patient. Patient has appointment at [facility] on Friday and will mention the need of new referral and hopefully Rudell Cobb will fax me one in the next day or so.

## 2023-07-18 NOTE — Telephone Encounter (Signed)
I received his questionnaire in the mail today.

## 2023-07-19 ENCOUNTER — Telehealth: Payer: Self-pay | Admitting: *Deleted

## 2023-07-19 NOTE — Telephone Encounter (Signed)
  Procedure: colonoscopy  Height: 5'11" Weight: 235 lb      BMI: 32.8  Have you had a colonoscopy before?  no  Do you have family history of colon cancer?  no  Do you have a family history of polyps? no  Previous colonoscopy with polyps removed? no  Do you have a history colorectal cancer?   no  Are you diabetic?  Yes, type 2  Do you have a prosthetic or mechanical heart valve? no  Do you have a pacemaker/defibrillator?   no  Have you had endocarditis/atrial fibrillation?  no  Do you use supplemental oxygen/CPAP?  no  Have you had joint replacement within the last 12 months?  no  Do you tend to be constipated or have to use laxatives?  no   Do you have history of alcohol use? If yes, how much and how often.  no  Do you have history or are you using drugs? If yes, what do are you  using?  no  Have you ever had a stroke/heart attack?  no  Have you ever had a heart or other vascular stent placed,?no  Do you take weight loss medication? no  Do you take any blood-thinning medications such as: (Plavix, aspirin, Coumadin, Aggrenox, Brilinta, Xarelto, Eliquis, Pradaxa, Savaysa or Effient)? no  If yes we need the name, milligram, dosage and who is prescribing doctor:  n/a             Current Outpatient Medications  Medication Sig Dispense Refill   atorvastatin (LIPITOR) 40 MG tablet Take 40 mg by mouth daily.     empagliflozin (JARDIANCE) 25 MG TABS tablet Take 25 mg by mouth daily.  TAKE ONE-HALF TABLET BY MOUTH DAILY     metFORMIN (GLUCOPHAGE) 500 MG tablet Take 500 mg by mouth 2 (two) times daily with a meal. TAKE ONE TABLET BY MOUTH TWICE A DAY FOR BLOOD SUGAR CONTROL FOR BLOOD SUGAR CONTROL     No current facility-administered medications for this visit.    No Known Allergies

## 2023-09-06 NOTE — Telephone Encounter (Signed)
Ok to schedule. ASA 2.  Jardiance hold 72 hours before tcs Day of prep metformin am only Day of tcs hold metformin

## 2023-09-10 NOTE — Telephone Encounter (Signed)
Called pt to schedule colonoscopy, he states that he would like November and to be after thanksgiving. Advised pt will call pt once we get providers schedule will give him a call to get him scheduled.

## 2023-09-19 ENCOUNTER — Encounter: Payer: Self-pay | Admitting: *Deleted

## 2023-09-19 ENCOUNTER — Other Ambulatory Visit: Payer: Self-pay | Admitting: *Deleted

## 2023-09-19 MED ORDER — PEG 3350-KCL-NA BICARB-NACL 420 G PO SOLR: 4000.0000 mL | Freq: Once | ORAL | 0 refills | Status: AC

## 2023-09-19 NOTE — Telephone Encounter (Signed)
Pt has been scheduled with Dr.Carver on 11/13/23. Instructions mailed and prep sen to the pharmacy.

## 2023-09-20 ENCOUNTER — Encounter: Payer: Self-pay | Admitting: *Deleted

## 2023-09-20 NOTE — Telephone Encounter (Signed)
Referral completed, TCS apt letter sent to PCP

## 2023-11-13 ENCOUNTER — Ambulatory Visit (HOSPITAL_COMMUNITY): Admission: RE | Admit: 2023-11-13 | Payer: No Typology Code available for payment source | Source: Home / Self Care

## 2023-11-13 ENCOUNTER — Encounter (HOSPITAL_COMMUNITY): Admission: RE | Payer: Self-pay | Source: Home / Self Care

## 2023-11-13 SURGERY — COLONOSCOPY WITH PROPOFOL
Anesthesia: Monitor Anesthesia Care
